# Patient Record
Sex: Male | Born: 1965 | Race: White | Hispanic: No | Marital: Married
Health system: Midwestern US, Academic
[De-identification: ages and names within clinical notes are randomized; demographics above are authoritative.]

---

## 2022-01-28 ENCOUNTER — Encounter: Admit: 2022-01-28 | Discharge: 2022-01-28 | Payer: 59

## 2022-01-28 DIAGNOSIS — R69 Illness, unspecified: Secondary | ICD-10-CM

## 2022-02-15 NOTE — Progress Notes
Radiation Oncology Consultation     History of Present Illness:  56 y.o. male presents with recurrent prostate cancer referred by self for consultation.     PSA history on initial diagnosis:  - 07/11/20: 8.12    On 10/03/20, Dr. Buckner Malta performed an ultrasound-guided prostate biopsy.  Prostate measured 18 cc.    Pathology showed:  - right base: Gleason 3+4 involving 30% of core.  - right lateral base: Gleason 6, 10%  - right lateral mid: Gleason 6, 15%  - left lateral base: 4+5, 50%  - left base: 4+5, 55%  - left lateral mid: 4+5, 95%  - left mid: 4+5, 45%  - left lateral apex: 4+5, 100%  - left apex: 4+5, 75%  A total of 9/12 cores contained cancer.    On 11/12/20, abdomen/pelvic CT at [facility] ): negative.  Same day bone scan: negative.    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     On 11/19/20, Dr. Buckner Malta performed a robotic prostatectomy at [facility].  Non-nerve sparing.    Surgical pathology showed:  - pGleason 4+5.  - Extraprostatic extension at left posterior  - Left seminal vesicle invasion and bladder neck invasion.  - Positive margin at left posterior.  - pT3bN0 (0/8 nodes)    PSA after prostatectomy:  - 03/19/21: <0.13  - 06/30/21: <0.13  - 10/27/21: 0.14  - 01/25/22: 0.19    Baseline symptoms:  - Urinary: IPSS score 3. Dribbling with heavy lifting. No pad use. Reports urgency. Denies dysuria, hematuria, or nocturia.   - GI: No diarrhea or rectal bleeding.   Most recent colonoscopy on 07/11/20. Patient reports follow-up in 10 years.   - Patient has baseline erectile dysfunction.     Past Medical History: Erectile dysfunction.   Prior Radiation History: None     Medications: Atorvastatin, Doxepin, gabapentin, Hydrocodone-acetaminophen, methocarbamol, montelukast, nabumetone, provigil, restasis, zaleplon, Zyrtec     Allergies: NKDA. Social History: Lives in [place], [place].   Accompanied by his wife at consultation today.     Family History: Sister had leukemia.     Review of Systems: see above.      Patient Evaluated for a Clinical Trial: No treatment clinical trial available for this patient.     KARNOFSKY PERFORMANCE SCORE: 90% Able to carry on normal activity; minor signs of disease     Physical Exam: No acute distress.     LABORATORY: see above.  IMAGING: see above  PATHOLOGY: see above        ASSESSMENT: 56 y.o. 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 Surgical pathology showed: pGleason 4+5 cancer, extraprostatic extension at left posterior, bladder neck invasion, left seminal vesicle invasion, positive margin at left posterior. pT3bN0 (0/8 nodes). Post-operative PSA has risen to 0.19 indicating recurrent prostate cancer after prostatectomy.     RECOMMENDATIONS: First, Dr. Imogene Burn recommends a PSMA PET scan to rule out metastatic disease. Findings from PSMA PET can also help with design of radiation treatment.    If the patient does not have distant metastatic disease, then we would recommend salvage radiation therapy with concurrent ADT, with curative intent. If patient has node-positive findings on PSMA PET, ADT would likely be a longer duration, potentially adding abiraterone to Lupron. If there is no nodal involvement,  then 6 months of Lupron would be reasonable.    We specifically reviewed that men with biochemical recurrence who received salvage therapy with a lower PSA (</= 0.2) at the time of initiation of radiotherapy have the best long-term disease control and cure. We also reviewed the role of androgen deprivation therapy (ADT) in salvage therapy. Our recommendations are based on the results of multiple randomized trials. We recommend short term ADT based on reported results from the randomized trial RTOG 9601, which demonstrated that adding hormone therapy to salvage radiotherapy improves overall survival Albertina Parr, NEJM 2017). The GETUG-16 randomized trial showed that adding 6 months of ADT to salvage radiotherapy improved 10-year progression-free survival and metastasis-free survival. RTOG 0534 showed that patients who received salvage RT (with pelvic irradiation) and 6 months of ADT had improved outcomes compared to patients without ADT.    We discussed the logistics of salvage radiation therapy including daily treatment (Monday-Friday) for approximately 7.5 weeks. Dr. Imogene Burn specifically discussed the possibility of receiving radiation treatment at [facility]. Patient declines because [place] is still a long drive for the patient.    We discussed the possible acute side effects of radiation including but not limited to fatigue, increased bowel or bladder frequency or urgency, and change in bowel habits. More long term side effects can also include these symptoms and potential erectile dysfunction.    We also discussed the potential side effects associated with hormonal therapy.  Androgen deprivation can result in hot flashes, fatigue, and decreased sexual interest.    After discussion, patient wishes to proceed with recommendations as detailed above.    Next steps:  - PSMA PET scan now.  - Then likely salvage RT with concurrent ADT.    Chad Cordial, M.D.  PGY-3 Resident Physician, Radiation Oncology  [facility]    ATTENDING NOTE: I saw and examined the patient. Agree with above. No acute distress. PSMA PET scan first to guide final treatment decisions.    Total Time Today was 60 minutes in the following activities: Preparing to see the patient, Obtaining and/or reviewing separately obtained history, Performing a medically appropriate examination and/or evaluation, Counseling and educating the patient/family/caregiver, Ordering medications, tests, or procedures, Referring and communication with other health care professionals (when not separately reported), Documenting clinical information in the electronic or other health record and Independently interpreting results (not separately reported) and communicating results to the patient/family/caregiver

## 2022-02-16 ENCOUNTER — Ambulatory Visit: Admit: 2022-02-16 | Discharge: 2022-02-16 | Payer: 59

## 2022-02-16 ENCOUNTER — Encounter: Admit: 2022-02-16 | Discharge: 2022-02-16 | Payer: 59

## 2022-02-16 DIAGNOSIS — C61 Malignant neoplasm of prostate: Secondary | ICD-10-CM

## 2022-02-16 DIAGNOSIS — M549 Dorsalgia, unspecified: Secondary | ICD-10-CM

## 2022-02-16 MED ORDER — LEUPROLIDE (3 MONTH) 22.5 MG IM SYKT
22.5 mg | Freq: Once | INTRAMUSCULAR | 0 refills
Start: 2022-02-16 — End: ?

## 2022-02-19 ENCOUNTER — Encounter: Admit: 2022-02-19 | Discharge: 2022-02-19 | Payer: 59

## 2022-03-08 ENCOUNTER — Ambulatory Visit: Admit: 2022-03-08 | Discharge: 2022-03-08 | Payer: 59

## 2022-03-08 ENCOUNTER — Encounter: Admit: 2022-03-08 | Discharge: 2022-03-08 | Payer: 59

## 2022-03-08 DIAGNOSIS — C61 Malignant neoplasm of prostate: Secondary | ICD-10-CM

## 2022-03-08 MED ORDER — RP DX F-18 PIFLUFOLASTAT (PSMA) 1 MCI
9 | Freq: Once | INTRAVENOUS | 0 refills | Status: CP
Start: 2022-03-08 — End: ?

## 2022-03-08 MED ORDER — LEUPROLIDE (3 MONTH) 22.5 MG IM SYKT
22.5 mg | Freq: Once | INTRAMUSCULAR | 0 refills | Status: CP
Start: 2022-03-08 — End: ?
  Administered 2022-03-08: 21:00:00 22.5 mg via INTRAMUSCULAR

## 2022-03-08 NOTE — Progress Notes
Patient received an injection of Lupron today. A dose of 22.5 mgs was given IM in right Deltoid. This was injection was # 1 of 2. Patient tolerated the procedure well.. Education was provided to the patient and all questions were answered. Note to nurse- if this is the patient's 4th dose, renew order and authorization for future doses, if applicable.     Pt came from out pt procedure with IV. IV discontinued at this time.

## 2022-03-22 NOTE — Progress Notes
Radiation Oncology Follow-Up Note    Date: 03/23/2022       Robert Terrell is a 56 y.o. male.     There were no encounter diagnoses.  Staging: Cancer Staging   No matching staging information was found for the patient.      Subjective:          Cancer Staging   No matching staging information was found for the patient.      History of Present Illness  Mr. Robert Terrell with a  56 y.o. 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 Surgical pathology showed: pGleason 4+5 cancer, extraprostatic extension at left posterior, bladder neck invasion, left seminal vesicle invasion, positive margin at left posterior. pT3bN0 (0/8 nodes). Post-operative PSA has risen to 0.19 indicating recurrent prostate cancer after prostatectomy.     Mr. Robert Terrell presents today for follow up and for his CT Simulation scan. He reports he has had one day of feeling some fatigue since starting hormone therapy but otherwise he hasn't noticed any changes at this time from the ADT.     Treatment Plan: Salvage radiation therapy (37 fractions)  + ADT x 6 months.     Lupron Dates: 03/08/22, next injection due on 06/08/22    PSA after prostatectomy:  - 03/19/21: <0.13  - 06/30/21: <0.13  - 10/27/21: 0.14  - 01/25/22: 0.19  ?  Baseline symptoms:  - Urinary: IPSS score 3. Dribbling with heavy lifting. No pad use. Reports urgency. Denies dysuria, hematuria, or nocturia.   - GI: No diarrhea or rectal bleeding.   Most recent colonoscopy on 07/11/20. Patient reports follow-up in 10 years.   - Patient has baseline erectile dysfunction.            Review of Systems   Genitourinary: Positive for urgency.   All other systems reviewed and are negative.        Objective:         ? atorvastatin (LIPITOR) 10 mg tablet Take one tablet by mouth daily.   ? doxepin (SINEQUAN) 100 mg capsule Take one capsule by mouth at bedtime daily.   ? gabapentin (NEURONTIN) 300 mg capsule Take one capsule by mouth every 8 hours.   ? HYDROcodone/acetaminophen (NORCO) 5/325 mg tablet Take one tablet to two tablets by mouth every 6 hours as needed for Pain.   ? methocarbamoL (ROBAXIN) 750 mg tablet Take one tablet by mouth four times daily.   ? mirtazapine 45 mg tablet Take one tablet by mouth at bedtime daily.   ? montelukast (SINGULAIR) 10 mg tablet Take one tablet by mouth at bedtime daily.   ? nabumetone (RELAFEN) 750 mg tablet Take one tablet by mouth twice daily.   ? zaleplon (SONATA) 10 mg capsule Take one capsule by mouth at bedtime daily.     Vitals:    03/23/22 1012   Pulse: (P) 68   Resp: 15   SpO2: 98%   PainSc: Zero   Weight: 98.8 kg (217 lb 13 oz)   Height: 175.3 cm (5' 9)     Body mass index is 32.17 kg/m?Marland Kitchen                      Physical Exam      General:  A&Ox3, in no acute distress.  Head:  Normocephalic and atraumatic.  Eyes: Normal sclerae / conjunctivae.  Neck: trachea midline  Lungs: Breathing nonlabored, no wheezing  Abdomen: nondistended  Skin: no rashes or pallor  Psychiatric: Normal mood and affect             Assessment and Plan:  Mr. Robert Terrell with a  56 y.o. 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 Surgical pathology showed: pGleason 4+5 cancer, extraprostatic extension at left posterior, bladder neck invasion, left seminal vesicle invasion, positive margin at left posterior. pT3bN0 (0/8 nodes). Post-operative PSA has risen to 0.19 indicating recurrent prostate cancer after prostatectomy.       1. Prostate Cancer  - Reviewed potential side effects of radiation treatment can include urinary symptoms (increased urinary frequency and urgency, dysuria, and obstructive symptoms), GI symptoms (loose stools and blood in stool), and sexual dysfunction.  However, urinary incontinence is relatively uncommon after radiation treatment.  - Reviewed treatment plan  - Reviewed and obtained informed consent for radiation therapy   - Proceed with CT simulation scan today in clinic        Katie Kami Kube APRN, FNP-C  Nurse Practitioner  Department of Radiation Oncology [facility]    Total time including obtaining and review of records, patient face to face time, order entry, placing referrals, and documentation: 40 minutes.

## 2022-03-23 ENCOUNTER — Ambulatory Visit: Admit: 2022-03-23 | Discharge: 2022-03-23 | Payer: 59

## 2022-03-23 ENCOUNTER — Encounter: Admit: 2022-03-23 | Discharge: 2022-03-23 | Payer: 59

## 2022-03-23 DIAGNOSIS — M549 Dorsalgia, unspecified: Secondary | ICD-10-CM

## 2022-03-23 DIAGNOSIS — C61 Malignant neoplasm of prostate: Secondary | ICD-10-CM

## 2022-03-23 NOTE — Progress Notes
Simulation Procedure Note    Date: 03/23/2022   MAKO PELFREY is a 56 y.o. male.     There were no encounter diagnoses.  Anatomical Site: pelvis    On 03/23/2022,  Mr. Grunewald underwent IMRT (INTENSITY MODULATED RADIATION THERAPY)  simulation procedure on the CT Simulator.      Informed consent has been obtained for the radiation oncology treatment.     The pelvis region was scanned in order to establish treatment fields for radiation therapy and is medically necessary to establish the treatment fields to the pelvis.    During the simulation process Brixon Zhen was setup in the supine position and a customized vac loc immobilization device was used.       Axial CT images were acquired through the region of interest in order to establish the appropriate isocenter.  These images will be transferred to the treatment planning system in order to identify the dose volumes for the targeted treatment area and for delineation of the normal critical structures, including the bowel and bladder  which are in close proximity of the targeted area of treatment.                   Verner Mould, MD

## 2022-03-30 ENCOUNTER — Ambulatory Visit: Admit: 2022-03-30 | Discharge: 2022-03-30 | Payer: 59

## 2022-04-01 ENCOUNTER — Encounter: Admit: 2022-04-01 | Discharge: 2022-04-01 | Payer: 59

## 2022-04-01 ENCOUNTER — Ambulatory Visit: Admit: 2022-04-01 | Discharge: 2022-04-01 | Payer: 59

## 2022-04-02 ENCOUNTER — Encounter: Admit: 2022-04-02 | Discharge: 2022-04-02 | Payer: 59

## 2022-04-02 ENCOUNTER — Ambulatory Visit: Admit: 2022-04-02 | Discharge: 2022-04-02 | Payer: 59

## 2022-04-05 ENCOUNTER — Encounter: Admit: 2022-04-05 | Discharge: 2022-04-05 | Payer: 59

## 2022-04-05 ENCOUNTER — Ambulatory Visit: Admit: 2022-04-05 | Discharge: 2022-04-05 | Payer: 59

## 2022-04-06 ENCOUNTER — Encounter: Admit: 2022-04-06 | Discharge: 2022-04-06 | Payer: 59

## 2022-04-06 ENCOUNTER — Ambulatory Visit: Admit: 2022-04-06 | Discharge: 2022-04-06 | Payer: 59

## 2022-04-07 ENCOUNTER — Encounter: Admit: 2022-04-07 | Discharge: 2022-04-07 | Payer: 59

## 2022-04-07 ENCOUNTER — Ambulatory Visit: Admit: 2022-04-07 | Discharge: 2022-04-07 | Payer: 59

## 2022-04-07 DIAGNOSIS — C61 Malignant neoplasm of prostate: Secondary | ICD-10-CM

## 2022-04-08 ENCOUNTER — Encounter: Admit: 2022-04-08 | Discharge: 2022-04-08 | Payer: 59

## 2022-04-08 ENCOUNTER — Ambulatory Visit: Admit: 2022-04-08 | Discharge: 2022-04-08 | Payer: 59

## 2022-04-08 NOTE — Progress Notes
Weekly Management Progress Note    Date: 04/07/2022    Robert Terrell is a 56 y.o. male.     Vitals:  There were no vitals filed for this visit.  Subjective   There were no encounter diagnoses.  Staging:  Cancer Staging   No matching staging information was found for the patient.    There is no height or weight on file to calculate BMI.             Baseline symptoms:  - Urinary: IPSS score 3. Dribbling with heavy lifting. No pad use. Reports urgency. Denies dysuria, hematuria, or nocturia.   - GI: No diarrhea or rectal bleeding.   Most recent colonoscopy on 07/11/20. Patient reports follow-up in 10 years.   - Patient has baseline erectile dysfunction.     Treatment Plan: Salvage radiation therapy (37 fractions)  + ADT x 6 months.     Treatment Data Summary:      Treatment Data 04/01/2022 04/02/2022 04/05/2022 04/06/2022 04/07/2022   Course ID C1 Pelvis 23 C1 Pelvis 23 C1 Pelvis 23 C1 Pelvis 23 C1 Pelvis 23   Plan ID Pelvis Pelvis Pelvis Pelvis Pelvis   Prescription Dose (cGy) 5,040 5,040 5,040 5,040 5,040   Prescribed Dose per Fraction (Gy) 1.8 1.8 1.8 1.8 1.8   Fractions Treated to Date 1 2 3 4 5    Total Fractions on Plan 28 28 28 28 28    Treatment Elapsed Days 0 1 4 5 6    Reference Point ID Pelvis Pelvis Pelvis Pelvis Pelvis   Dosage Given to Date (Gy) 1.8 3.6 5.4 7.2 9        Subjective:  Robert Terrell reports that he is doing well.  No changes in bowels or urination.  He has noted a slight burning sensation when he wipes after bowel movements - this started over the weekend.  He does have a history of hemorrhoids.      Objective:   Vitals:    04/07/22 1910   BP: (!) 146/90   Pulse: 64   SpO2: 99%     NAD, AOx3     Assessment: Doing well.  Started RT    Plan:   1.  Continue RT  2.  Monitor the burning sensation in the peri-anal area.  I think this may be hemorrhoids.  Would try rectal hydrocortisone if persists.    Harriette Bouillon, MD  Attending Physician  Department of Radiation Oncology, [facility]    ATTESTATION    I personally performed the E/M including history, physical exam, and MDM.    Staff name:  Harriette Bouillon, MD Date: 04/07/2022

## 2022-04-09 ENCOUNTER — Encounter: Admit: 2022-04-09 | Discharge: 2022-04-09 | Payer: 59

## 2022-04-09 ENCOUNTER — Ambulatory Visit: Admit: 2022-04-09 | Discharge: 2022-04-09 | Payer: 59

## 2022-04-12 ENCOUNTER — Encounter: Admit: 2022-04-12 | Discharge: 2022-04-12 | Payer: 59

## 2022-04-12 ENCOUNTER — Ambulatory Visit: Admit: 2022-04-12 | Discharge: 2022-04-12 | Payer: 59

## 2022-04-13 ENCOUNTER — Encounter: Admit: 2022-04-13 | Discharge: 2022-04-13 | Payer: 59

## 2022-04-13 ENCOUNTER — Ambulatory Visit: Admit: 2022-04-13 | Discharge: 2022-04-13 | Payer: 59

## 2022-04-13 DIAGNOSIS — C61 Malignant neoplasm of prostate: Secondary | ICD-10-CM

## 2022-04-14 ENCOUNTER — Encounter: Admit: 2022-04-14 | Discharge: 2022-04-14 | Payer: 59

## 2022-04-14 ENCOUNTER — Ambulatory Visit: Admit: 2022-04-14 | Discharge: 2022-04-14 | Payer: 59

## 2022-04-14 NOTE — Progress Notes
Weekly Management Progress Note    Date: 04/13/2022    Robert Terrell is a 56 y.o. male.     Vitals:  Vitals:    04/13/22 1908   BP: 136/60   Pulse: 79   SpO2: 99%     Subjective   There were no encounter diagnoses.  Staging:  Cancer Staging   No matching staging information was found for the patient.    There is no height or weight on file to calculate BMI.             Baseline symptoms:  - Urinary: IPSS score 2. Dribbling with heavy lifting. No pad use. Reports urgency less than 1 time in 5. Denies dysuria, hematuria. Nocturia x 1. Mostly satisfied  - GI: No diarrhea or rectal bleeding.   Most recent colonoscopy on 07/11/20. Patient reports follow-up in 10 years.   - Patient has baseline erectile dysfunction.     Treatment Plan: Salvage radiation therapy (37 fractions)  + ADT x 6 months.     Treatment Data Summary:      Treatment Data 04/02/2022 04/05/2022 04/06/2022 04/07/2022 04/08/2022 04/09/2022 04/12/2022   Course ID C1 Pelvis 23 C1 Pelvis 23 C1 Pelvis 23 C1 Pelvis 23 C1 Pelvis 23 C1 Pelvis 23 C1 Pelvis 23   Plan ID Pelvis Pelvis Pelvis Pelvis Pelvis Pelvis Pelvis   Prescription Dose (cGy) 5,040 5,040 5,040 5,040 5,040 5,040 5,040   Prescribed Dose per Fraction (Gy) 1.8 1.8 1.8 1.8 1.8 1.8 1.8   Fractions Treated to Date 2 3 4 5 6 7 8    Total Fractions on Plan 28 28 28 28 28 28 28    Treatment Elapsed Days 1 4 5 6 7 8 11    Reference Point ID Pelvis Pelvis Pelvis Pelvis Pelvis Pelvis Pelvis   Dosage Given to Date (Gy) 3.6 5.4 7.2 9 10.8 12.6 14.4      Subjective:  Mr. Chura reports that he is doing well.  No changes in bowels or urination.  He has noted a slight burning sensation when he wipes after bowel movements - this started over the weekend.  He does have a history of hemorrhoids.      Objective:   Vitals:    04/13/22 1908   BP: 136/60   Pulse: 79   SpO2: 99%     NAD, AOx3     Assessment: Doing well.  Started RT    Plan:   1.  Continue RT  2.  Monitor the burning sensation in the peri-anal area. Likely hemorrhoids.  Trying hydrocortisone cream    Raechel Chute, MD  Attending Physician  Department of Radiation Oncology, [facility]    ATTESTATION    I personally performed the E/M including history, physical exam, and MDM.    Staff name:  Morton Stall, MD Date: 04/13/2022

## 2022-04-16 ENCOUNTER — Encounter: Admit: 2022-04-16 | Discharge: 2022-04-16 | Payer: 59

## 2022-04-16 ENCOUNTER — Ambulatory Visit: Admit: 2022-04-16 | Discharge: 2022-04-16 | Payer: 59

## 2022-04-19 ENCOUNTER — Ambulatory Visit: Admit: 2022-04-19 | Discharge: 2022-04-19 | Payer: 59

## 2022-04-19 ENCOUNTER — Encounter: Admit: 2022-04-19 | Discharge: 2022-04-19 | Payer: 59

## 2022-04-20 ENCOUNTER — Encounter: Admit: 2022-04-20 | Discharge: 2022-04-20 | Payer: 59

## 2022-04-20 ENCOUNTER — Ambulatory Visit: Admit: 2022-04-20 | Discharge: 2022-04-20 | Payer: 59

## 2022-04-20 DIAGNOSIS — C61 Malignant neoplasm of prostate: Secondary | ICD-10-CM

## 2022-04-20 DIAGNOSIS — M549 Dorsalgia, unspecified: Secondary | ICD-10-CM

## 2022-04-21 ENCOUNTER — Ambulatory Visit: Admit: 2022-04-21 | Discharge: 2022-04-21 | Payer: 59

## 2022-04-21 ENCOUNTER — Encounter: Admit: 2022-04-21 | Discharge: 2022-04-21 | Payer: 59

## 2022-04-23 ENCOUNTER — Encounter: Admit: 2022-04-23 | Discharge: 2022-04-23 | Payer: 59

## 2022-04-23 ENCOUNTER — Ambulatory Visit: Admit: 2022-04-23 | Discharge: 2022-04-23 | Payer: 59

## 2022-04-26 ENCOUNTER — Ambulatory Visit: Admit: 2022-04-26 | Discharge: 2022-04-26 | Payer: 59

## 2022-04-26 ENCOUNTER — Encounter: Admit: 2022-04-26 | Discharge: 2022-04-26 | Payer: 59

## 2022-04-26 DIAGNOSIS — C61 Malignant neoplasm of prostate: Secondary | ICD-10-CM

## 2022-04-26 NOTE — Progress Notes
Weekly Management Progress Note    Date: 04/26/2022      History of Present Illness: 56 y.o. 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 Surgical pathology showed: pGleason 4+5 cancer, extraprostatic extension at left posterior, bladder neck invasion, left seminal vesicle invasion, positive margin at left posterior. pT3bN0 (0/8 nodes). Post-operative PSA has risen to 0.19 indicating recurrent prostate cancer after prostatectomy.     Treatment Plan: Salvage RT with 6 months of androgen deprivation therapy - with curative intent.  - Lupron #1 given on 03/08/22.    Baseline symptoms:  - Urinary: IPSS score 2. Dribbling with heavy lifting. No pad use. Reports urgency less than 1 time in 5. Denies dysuria, hematuria. Nocturia x 1. Mostly satisfied  - GI: No diarrhea or rectal bleeding.   Most recent colonoscopy on 07/11/20. Patient reports follow-up in 10 years.   - Patient has baseline erectile dysfunction.     Current symptoms: patient has received 17/37 radiation treatments.  - Urinary: IPSS score 2. Nocturia 1x. No other symptoms. No pad.  - GI; No diarrhea or rectal bleeding.    Vitals:  Vitals:    04/26/22 1841   BP: (!) 155/93   BP Source: Arm, Left Upper   Pulse: 61   SpO2: 100%   PainSc: Zero     Subjective   There were no encounter diagnoses.  Staging:  Cancer Staging   No matching staging information was found for the patient.    There is no height or weight on file to calculate BMI.  Pain Score: Zero            Treatment Data Summary:      Treatment Data 04/15/2022 04/16/2022 04/19/2022 04/20/2022 04/21/2022 04/23/2022 04/26/2022   Course ID C1 Pelvis 23 C1 Pelvis 23 C1 Pelvis 23 C1 Pelvis 23 C1 Pelvis 23 C1 Pelvis 23 C1 Pelvis 23   Plan ID Pelvis Pelvis Pelvis Pelvis Pelvis Pelvis Pelvis   Prescription Dose (cGy) 5,040 5,040 5,040 5,040 5,040 5,040 5,040   Prescribed Dose per Fraction (Gy) 1.8 1.8 1.8 1.8 1.8 1.8 1.8   Fractions Treated to Date 11 12 13 14 15 16 17    Total Fractions on Plan 28 28 28 28 28 28 28    Treatment Elapsed Days 14 15 18 19 20 22 25    Reference Point ID Pelvis Pelvis Pelvis Pelvis Pelvis Pelvis Pelvis   Dosage Given to Date (Gy) 19.8 21.6 23.4 25.2 27 28.8 30.6       Objective:   Vitals:    04/26/22 1841   BP: (!) 155/93   Pulse: 61   SpO2: 100%     NAD, AOx3     Assessment/Plan:   - Patient is doing well with salvage radiation therapy for recurrent prostate cancer. He has essentially no acute GI or urinary side effects from treatment.  - Continue RT.

## 2022-04-27 ENCOUNTER — Encounter: Admit: 2022-04-27 | Discharge: 2022-04-27 | Payer: 59

## 2022-04-27 ENCOUNTER — Ambulatory Visit: Admit: 2022-04-27 | Discharge: 2022-04-27 | Payer: 59

## 2022-04-28 ENCOUNTER — Ambulatory Visit: Admit: 2022-04-28 | Discharge: 2022-04-28 | Payer: 59

## 2022-04-28 ENCOUNTER — Encounter: Admit: 2022-04-28 | Discharge: 2022-04-28 | Payer: 59

## 2022-04-29 ENCOUNTER — Encounter: Admit: 2022-04-29 | Discharge: 2022-04-29 | Payer: 59

## 2022-04-29 ENCOUNTER — Ambulatory Visit: Admit: 2022-04-29 | Discharge: 2022-04-29 | Payer: 59

## 2022-04-30 ENCOUNTER — Encounter: Admit: 2022-04-30 | Discharge: 2022-04-30 | Payer: 59

## 2022-04-30 ENCOUNTER — Ambulatory Visit: Admit: 2022-04-30 | Discharge: 2022-04-30 | Payer: 59

## 2022-05-03 ENCOUNTER — Encounter: Admit: 2022-05-03 | Discharge: 2022-05-03 | Payer: 59

## 2022-05-03 ENCOUNTER — Ambulatory Visit: Admit: 2022-05-03 | Discharge: 2022-05-03 | Payer: 59

## 2022-05-04 ENCOUNTER — Encounter: Admit: 2022-05-04 | Discharge: 2022-05-04 | Payer: 59

## 2022-05-04 ENCOUNTER — Ambulatory Visit: Admit: 2022-05-04 | Discharge: 2022-05-04 | Payer: 59

## 2022-05-04 DIAGNOSIS — M549 Dorsalgia, unspecified: Secondary | ICD-10-CM

## 2022-05-04 DIAGNOSIS — C61 Malignant neoplasm of prostate: Secondary | ICD-10-CM

## 2022-05-05 ENCOUNTER — Encounter: Admit: 2022-05-05 | Discharge: 2022-05-05 | Payer: 59

## 2022-05-05 ENCOUNTER — Ambulatory Visit: Admit: 2022-05-05 | Discharge: 2022-05-05 | Payer: 59

## 2022-05-05 NOTE — Progress Notes
Weekly Management Progress Note    Date: 05/04/2022      History of Present Illness: 56 y.o. 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 Surgical pathology showed: pGleason 4+5 cancer, extraprostatic extension at left posterior, bladder neck invasion, left seminal vesicle invasion, positive margin at left posterior. pT3bN0 (0/8 nodes). Post-operative PSA has risen to 0.19 indicating recurrent prostate cancer after prostatectomy.     Treatment Plan: Salvage RT with 6 months of androgen deprivation therapy - with curative intent.  - Lupron #1 given on 03/08/22.    Baseline symptoms:  - Urinary: IPSS score 2. Dribbling with heavy lifting. No pad use. Reports urgency less than 1 time in 5. Denies dysuria, hematuria. Nocturia x 1. Mostly satisfied  - GI: No diarrhea or rectal bleeding.   Most recent colonoscopy on 07/11/20. Patient reports follow-up in 10 years.   - Patient has baseline erectile dysfunction.     Current symptoms: patient has received 23/37 radiation treatments.  - Urinary: IPSS score 2. Nocturia 1x. No other symptoms. No pad.  - GI; No diarrhea or rectal bleeding.    Vitals:  Vitals:    05/04/22 1903   BP: (!) 151/88   Pulse: 69   SpO2: 98%   PainSc: Zero     Subjective   There were no encounter diagnoses.  Staging:  Cancer Staging   No matching staging information was found for the patient.    There is no height or weight on file to calculate BMI.  Pain Score: Zero            Treatment Data Summary:      Treatment Data 04/27/2022 04/27/2022 04/28/2022 04/29/2022 04/30/2022 05/03/2022 05/04/2022   Course ID C1 Pelvis 23 C1 Pelvis 23 C1 Pelvis 23 C1 Pelvis 23 C1 Pelvis 23 C1 Pelvis 23 C1 Pelvis 23   Plan ID Pelvis Pelvis Pelvis Pelvis Pelvis Pelvis Pelvis   Prescription Dose (cGy) 5,040 5,040 5,040 5,040 5,040 5,040 5,040   Prescribed Dose per Fraction (Gy) 1.8 1.8 1.8 1.8 1.8 1.8 1.8   Fractions Treated to Date 18 18 19 20 21 22 23    Total Fractions on Plan 28 28 28 28 28 28 28    Treatment Elapsed Days 26 26 27 28 29  32 33   Reference Point ID Pelvis Pelvis Pelvis Pelvis Pelvis Pelvis Pelvis   Dosage Given to Date (Gy) 56.21308657 32.4 34.2 36 37.8 39.6 41.4       Objective:   Vitals:    05/04/22 1903   BP: (!) 151/88   Pulse: 69   SpO2: 98%     NAD, AOx3     Assessment/Plan:   - Patient is doing well with salvage radiation therapy for recurrent prostate cancer. He has essentially no acute GI or urinary side effects from treatment.  - Continue RT.

## 2022-05-06 ENCOUNTER — Ambulatory Visit: Admit: 2022-05-06 | Discharge: 2022-05-06 | Payer: 59

## 2022-05-06 ENCOUNTER — Encounter: Admit: 2022-05-06 | Discharge: 2022-05-06 | Payer: 59

## 2022-05-07 ENCOUNTER — Ambulatory Visit: Admit: 2022-05-07 | Discharge: 2022-05-07 | Payer: 59

## 2022-05-07 ENCOUNTER — Encounter: Admit: 2022-05-07 | Discharge: 2022-05-07 | Payer: 59

## 2022-05-10 ENCOUNTER — Ambulatory Visit: Admit: 2022-05-10 | Discharge: 2022-05-10 | Payer: 59

## 2022-05-10 ENCOUNTER — Encounter: Admit: 2022-05-10 | Discharge: 2022-05-10 | Payer: 59

## 2022-05-11 ENCOUNTER — Encounter: Admit: 2022-05-11 | Discharge: 2022-05-11 | Payer: 59

## 2022-05-11 ENCOUNTER — Ambulatory Visit: Admit: 2022-05-11 | Discharge: 2022-05-11 | Payer: 59

## 2022-05-11 DIAGNOSIS — C61 Malignant neoplasm of prostate: Secondary | ICD-10-CM

## 2022-05-11 NOTE — Progress Notes
Weekly Management Progress Note    Date: 05/11/2022      History of Present Illness: 56 y.o. 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 Surgical pathology showed: pGleason 4+5 cancer, extraprostatic extension at left posterior, bladder neck invasion, left seminal vesicle invasion, positive margin at left posterior. pT3bN0 (0/8 nodes). Post-operative PSA has risen to 0.19 indicating recurrent prostate cancer after prostatectomy.     Treatment Plan: Salvage RT with 6 months of androgen deprivation therapy - with curative intent.  - Lupron #1 given on 03/08/22.    Baseline symptoms:  - Urinary: IPSS score 2. Dribbling with heavy lifting. No pad use. Reports urgency less than 1 time in 5. Denies dysuria, hematuria. Nocturia x 1. Mostly satisfied  - GI: No diarrhea or rectal bleeding.   Most recent colonoscopy on 07/11/20. Patient reports follow-up in 10 years.   - Patient has baseline erectile dysfunction.     Current symptoms: patient has received 28/37 radiation treatments.  - Urinary: IPSS score 7. Nocturia 2x. Some urgency and frequency. No pad.  - GI; No diarrhea or rectal bleeding.    Vitals:  Vitals:    05/11/22 1633 05/11/22 1635   BP: (!) 140/85    Pulse: 76    Temp: 36.4 ?C (97.6 ?F)    Resp: 16    SpO2: 99%    TempSrc: Temporal    PainSc:  Zero   Weight: 95.8 kg (211 lb 3.2 oz)    Height: 175.3 cm (5' 9)      Subjective   There were no encounter diagnoses.  Staging:  Cancer Staging   No matching staging information was found for the patient.    Body mass index is 31.19 kg/m?Marland Kitchen  Pain Score: Zero     Fatigue Scale: 1      Treatment Data Summary:      Treatment Data 04/30/2022 05/03/2022 05/04/2022 05/05/2022 05/06/2022 05/07/2022 05/10/2022   Course ID C1 Pelvis 23 C1 Pelvis 23 C1 Pelvis 23 C1 Pelvis 23 C1 Pelvis 23 C1 Pelvis 23 C1 Pelvis 23   Plan ID Pelvis Pelvis Pelvis Pelvis Pelvis Pelvis Pelvis   Prescription Dose (cGy) 5,040 5,040 5,040 5,040 5,040 5,040 5,040   Prescribed Dose per Fraction (Gy) 1.8 1.8 1.8 1.8 1.8 1.8 1.8   Fractions Treated to Date 21 22 23 24 25 26 27    Total Fractions on Plan 28 28 28 28 28 28 28    Treatment Elapsed Days 29 32 33 34 35 36 39   Reference Point ID Pelvis Pelvis Pelvis Pelvis Pelvis Pelvis Pelvis   Dosage Given to Date (Gy) 37.8 39.6 41.4 43.2 45 46.8 48.6       Objective:   Vitals:    05/11/22 1633   BP: (!) 140/85   Pulse: 76   Temp: 36.4 ?C (97.6 ?F)   Resp: 16   SpO2: 99%     NAD, AOx3     Assessment/Plan:   - Patient is doing well with salvage radiation therapy for recurrent prostate cancer. He has no significant acute GI or urinary side effects from treatment.  - Continue RT.

## 2022-05-12 ENCOUNTER — Ambulatory Visit: Admit: 2022-05-12 | Discharge: 2022-05-12 | Payer: 59

## 2022-05-12 ENCOUNTER — Encounter: Admit: 2022-05-12 | Discharge: 2022-05-12 | Payer: 59

## 2022-05-13 ENCOUNTER — Encounter: Admit: 2022-05-13 | Discharge: 2022-05-13 | Payer: 59

## 2022-05-13 ENCOUNTER — Ambulatory Visit: Admit: 2022-05-13 | Discharge: 2022-05-13 | Payer: 59

## 2022-05-14 ENCOUNTER — Ambulatory Visit: Admit: 2022-05-14 | Discharge: 2022-05-14 | Payer: 59

## 2022-05-14 ENCOUNTER — Encounter: Admit: 2022-05-14 | Discharge: 2022-05-14 | Payer: 59

## 2022-05-19 ENCOUNTER — Ambulatory Visit: Admit: 2022-05-19 | Discharge: 2022-05-19 | Payer: 59

## 2022-05-19 ENCOUNTER — Encounter: Admit: 2022-05-19 | Discharge: 2022-05-19 | Payer: 59

## 2022-05-19 DIAGNOSIS — C61 Malignant neoplasm of prostate: Secondary | ICD-10-CM

## 2022-05-20 ENCOUNTER — Encounter: Admit: 2022-05-20 | Discharge: 2022-05-20 | Payer: 59

## 2022-05-20 ENCOUNTER — Ambulatory Visit: Admit: 2022-05-20 | Discharge: 2022-05-20 | Payer: 59

## 2022-05-20 NOTE — Progress Notes
Weekly Management Progress Note    Date: 05/19/2022      History of Present Illness: 56 y.o. 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 Surgical pathology showed: pGleason 4+5 cancer, extraprostatic extension at left posterior, bladder neck invasion, left seminal vesicle invasion, positive margin at left posterior. pT3bN0 (0/8 nodes). Post-operative PSA has risen to 0.19 indicating recurrent prostate cancer after prostatectomy.     Treatment Plan: Salvage RT with 6 months of androgen deprivation therapy - with curative intent.  - Lupron #1 given on 03/08/22.    Baseline symptoms:  - Urinary: IPSS score 2. Dribbling with heavy lifting. No pad use. Reports urgency less than 1 time in 5. Denies dysuria, hematuria. Nocturia x 1. Mostly satisfied  - GI: No diarrhea or rectal bleeding.   Most recent colonoscopy on 07/11/20. Patient reports follow-up in 10 years.   - Patient has baseline erectile dysfunction.     Current symptoms: patient has received 32/37 radiation treatments.  - Reports nighttime hot flashes.  - Urinary: IPSS score 3. Nocturia 1x. Mild urgency and frequency. No pad.  - GI; No diarrhea or rectal bleeding.    Vitals:  Vitals:    05/19/22 1917   BP: (!) 145/84   BP Source: Arm, Left Upper   Pulse: 69   SpO2: 99%   PainSc: Zero   Weight: 95.2 kg (209 lb 14.1 oz)     Subjective   There were no encounter diagnoses.  Staging:  Cancer Staging   No matching staging information was found for the patient.    Body mass index is 30.99 kg/m?Marland Kitchen  Pain Score: Zero            Treatment Data Summary:      Treatment Data 05/07/2022 05/10/2022 05/11/2022 05/12/2022 05/13/2022 05/14/2022 05/19/2022   Course ID C1 Pelvis 23 C1 Pelvis 23 C1 Pelvis 23 C1 Pelvis 23 C1 Pelvis 23 C1 Pelvis 23 C1 Pelvis 23   Plan ID Pelvis Pelvis Pelvis ProstateBedBV ProstateBedBV ProstateBedBV ProstateBedBV Prescription Dose (cGy) 5,040 5,040 5,040 1,620 1,620 1,620 1,620   Prescribed Dose per Fraction (Gy) 1.8 1.8 1.8 1.8 1.8 1.8 1.8   Fractions Treated to Date 26 27 28 1 2 3 4    Total Fractions on Plan 28 28 28 9 9 9 9    Treatment Elapsed Days 36 39 40 41 42 43 48   Reference Point ID Pelvis Pelvis Pelvis ProstateBedBV ProstateBedBV ProstateBedBV ProstateBedBV   Dosage Given to Date (Gy) 46.8 48.6 50.4 1.8 3.6 5.4 7.2       Objective:   Vitals:    05/19/22 1917   BP: (!) 145/84   Pulse: 69   SpO2: 99%     NAD, AOx3     Assessment/Plan:   - Patient is doing well with salvage radiation therapy for recurrent prostate cancer. He has no significant acute GI or urinary side effects from treatment.  - Continue RT.

## 2022-05-21 ENCOUNTER — Ambulatory Visit: Admit: 2022-05-21 | Discharge: 2022-05-21 | Payer: 59

## 2022-05-21 ENCOUNTER — Encounter: Admit: 2022-05-21 | Discharge: 2022-05-21 | Payer: 59

## 2022-05-24 ENCOUNTER — Encounter: Admit: 2022-05-24 | Discharge: 2022-05-24 | Payer: 59

## 2022-05-24 ENCOUNTER — Ambulatory Visit: Admit: 2022-05-24 | Discharge: 2022-05-24 | Payer: 59

## 2022-05-25 ENCOUNTER — Ambulatory Visit: Admit: 2022-05-25 | Discharge: 2022-05-25 | Payer: 59

## 2022-05-25 ENCOUNTER — Encounter: Admit: 2022-05-25 | Discharge: 2022-05-25 | Payer: 59

## 2022-05-26 ENCOUNTER — Ambulatory Visit: Admit: 2022-05-26 | Discharge: 2022-05-26 | Payer: 59

## 2022-05-26 ENCOUNTER — Encounter: Admit: 2022-05-26 | Discharge: 2022-05-26 | Payer: 59

## 2022-06-07 ENCOUNTER — Ambulatory Visit: Admit: 2022-06-07 | Discharge: 2022-06-07 | Payer: 59

## 2022-06-07 ENCOUNTER — Encounter: Admit: 2022-06-07 | Discharge: 2022-06-07 | Payer: 59

## 2022-06-07 DIAGNOSIS — C61 Malignant neoplasm of prostate: Secondary | ICD-10-CM

## 2022-06-07 MED ORDER — LEUPROLIDE (3 MONTH) 22.5 MG IM SYKT
22.5 mg | Freq: Once | INTRAMUSCULAR | 0 refills | Status: CP
Start: 2022-06-07 — End: ?
  Administered 2022-06-07: 21:00:00 22.5 mg via INTRAMUSCULAR

## 2022-06-07 NOTE — Progress Notes
Patient received an injection of lupron today. A dose of 22.5 mgs was given IM in right deltoid. This was injection was # 2 of 2. Patient tolerated the procedure well.. Education was provided to the patient and all questions were answered. Note to nurse- if this is the patient's 4th dose, renew order and authorization for future doses, if applicable.

## 2022-08-20 ENCOUNTER — Encounter: Admit: 2022-08-20 | Discharge: 2022-08-20 | Payer: 59

## 2022-08-24 NOTE — Progress Notes
Radiation Oncology Follow-Up Note    Date: 08/27/2022       Robert Terrell is a 56 y.o. male.     There were no encounter diagnoses.  Staging:  Cancer Staging   No matching staging information was found for the patient.      Subjective:           Cancer Staging   No matching staging information was found for the patient.      History of Present Illness  Robert Terrell with a  56 y.o. 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 Surgical pathology showed: pGleason 4+5 cancer, extraprostatic extension at left posterior, bladder neck invasion, left seminal vesicle invasion, positive margin at left posterior. pT3bN0 (0/8 nodes). Post-operative PSA has risen to 0.19 indicating recurrent prostate cancer after prostatectomy.     Robert Terrell presents today for his 3 month follow up s/p salvage radiation therapy. He is having some hot flashes and some fatigue but this is tolerable.  He continues to remains active.     He denies any changes in his health or concerns at today's visit.     Treatment Plan: Salvage radiation therapy (37 fractions)  + ADT x 6 months.     Lupron Dates: 03/08/22, 06/07/22,    PSA after prostatectomy:  - 03/19/21: <0.13  - 06/30/21: <0.13  - 10/27/21: 0.14  - 01/25/22: 0.19  ?  Baseline symptoms:  - Urinary: IPSS score 3. Dribbling with heavy lifting. No pad use. Reports urgency. Denies dysuria, hematuria, or nocturia.   - GI: No diarrhea or rectal bleeding.   Most recent colonoscopy on 07/11/20. Patient reports follow-up in 10 years.   - Patient has baseline erectile dysfunction.     Current Symptoms:  Urinary: IPSS=1, he has some urgency , denies straining, incontinence, frequency, hematuria, dysuria.   GI: denies diarrhea or blood in stool.            Review of Systems  See above.     Objective:         ? atorvastatin (LIPITOR) 10 mg tablet Take one tablet by mouth daily.   ? doxepin (SINEQUAN) 100 mg capsule Take one capsule by mouth at bedtime daily.   ? gabapentin (NEURONTIN) 300 mg capsule Take one capsule by mouth every 8 hours.   ? HYDROcodone/acetaminophen (NORCO) 5/325 mg tablet Take one tablet to two tablets by mouth every 6 hours as needed for Pain.   ? methocarbamoL (ROBAXIN) 750 mg tablet Take one tablet by mouth four times daily.   ? mirtazapine 45 mg tablet Take one tablet by mouth at bedtime daily.   ? montelukast (SINGULAIR) 10 mg tablet Take one tablet by mouth at bedtime daily.   ? nabumetone (RELAFEN) 750 mg tablet Take one tablet by mouth twice daily.   ? zaleplon (SONATA) 10 mg capsule Take one capsule by mouth at bedtime daily.     Vitals:    08/27/22 1019   BP: 117/78   BP Source: Arm, Left Upper   Pulse: 71   Temp: 36.1 ?C (96.9 ?F)   SpO2: 97%   TempSrc: Temporal   PainSc: Zero   Weight: 98.3 kg (216 lb 11.4 oz)   Height: 175.3 cm (5' 9)     Body mass index is 32 kg/m?Marland Kitchen  Physical Exam      General:  A&Ox3, in no acute distress.  Head:  Normocephalic and atraumatic.  Eyes: Normal sclerae / conjunctivae.  Neck: trachea midline  Lungs: Breathing nonlabored, no wheezing  Abdomen: nondistended  Skin: no rashes or pallor  Psychiatric: Normal mood and affect      Lab Results   Component Value Date    PSA <0.01 08/27/2022       Testosterone,Total   Date Value Ref Range Status   08/27/2022 10 (L) 270 - 1,070 NG/DL Final              Assessment and Plan:  Robert Terrell with a  56 y.o. 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 Surgical pathology showed: pGleason 4+5 cancer, extraprostatic extension at left posterior, bladder neck invasion, left seminal vesicle invasion, positive margin at left posterior. pT3bN0 (0/8 nodes). Post-operative PSA has risen to 0.19 indicating recurrent prostate cancer after prostatectomy. 1. Prostate Cancer  - PSA today is <0.01 this remains undetectable.   - Testosterone today is 10- this remains in castrate range  - Discussed expected testosterone recovery timeline.   - Robert Terrell has very little side effects from his radiation therapy. He reports he may have some urgency but his IPSS score is improved from baseline. IPSS=1 today vs IPSS=3 at baseline.   - Will continue to monitor PSA  - Discussed expected follow up schedule to consist of another PSA in 3 months and we will then see him every 6 months with a PSA.   - Follow up in 3 months for labs and clinic visit      Melodye Ped APRN, FNP-C  Nurse Practitioner  Department of Radiation Oncology [facility]    Total time including obtaining and review of records, patient face to face time, order entry, placing referrals, and documentation: 30 minutes

## 2022-08-26 ENCOUNTER — Encounter: Admit: 2022-08-26 | Discharge: 2022-08-26 | Payer: 59

## 2022-08-26 DIAGNOSIS — C61 Malignant neoplasm of prostate: Secondary | ICD-10-CM

## 2022-08-27 ENCOUNTER — Encounter: Admit: 2022-08-27 | Discharge: 2022-08-27 | Payer: BC Managed Care – PPO

## 2022-08-27 ENCOUNTER — Ambulatory Visit: Admit: 2022-08-27 | Discharge: 2022-08-27 | Payer: BC Managed Care – PPO

## 2022-08-27 DIAGNOSIS — C61 Malignant neoplasm of prostate: Secondary | ICD-10-CM

## 2022-08-27 DIAGNOSIS — M549 Dorsalgia, unspecified: Secondary | ICD-10-CM

## 2022-08-27 LAB — TESTOSTERONE,TOTAL: TESTOSTERONE, TOTAL: 10 ng/dL — ABNORMAL LOW (ref 270–1070)

## 2022-08-27 LAB — PROSTATIC SPECIFIC ANTIGEN-PSA

## 2022-11-26 NOTE — Progress Notes
Radiation Oncology Follow-Up Note    Date: 11/30/2022       Robert Terrell is a 56 y.o. male.     There were no encounter diagnoses.  Staging:  Cancer Staging   No matching staging information was found for the patient.      Subjective:           Cancer Staging   No matching staging information was found for the patient.      History of Present Illness  Robert Terrell with a  56 y.o. 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 Surgical pathology showed: pGleason 4+5 cancer, extraprostatic extension at left posterior, bladder neck invasion, left seminal vesicle invasion, positive margin at left posterior. pT3bN0 (0/8 nodes). Post-operative PSA has risen to 0.19 indicating recurrent prostate cancer after prostatectomy.     Robert Terrell presents today for his 6 month follow up s/p salvage radiation therapy. He is having some hot flashes still but very minimal amount. He reports he feels he gets his energy is coming back.  He continues to remains active. 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     He denies any changes in his health or concerns at today's visit.     Treatment Plan: Salvage radiation therapy (37 fractions) (completed on 05/26/22)  + ADT x 6 months.     Lupron Dates: 03/08/22, 06/07/22,    PSA after prostatectomy:  - 03/19/21: <0.13  - 06/30/21: <0.13  - 10/27/21: 0.14  - 01/25/22: 0.19     Baseline symptoms:  - Urinary: IPSS score 3. Dribbling with heavy lifting. No pad use. Reports urgency. Denies dysuria, hematuria, or nocturia.   - GI: No diarrhea or rectal bleeding.   Most recent colonoscopy on 07/11/20. Patient reports follow-up in 10 years.   - Patient has baseline erectile dysfunction.     Current Symptoms:  Urinary: IPSS=1, he has some urgency , denies straining, incontinence, frequency, hematuria, dysuria.   GI: denies diarrhea or blood in stool. Review of Systems  See above.     Objective:          atorvastatin (LIPITOR) 10 mg tablet Take one tablet by mouth daily.    doxepin (SINEQUAN) 100 mg capsule Take one capsule by mouth at bedtime daily.    gabapentin (NEURONTIN) 300 mg capsule Take one capsule by mouth every 8 hours.    methocarbamoL (ROBAXIN) 750 mg tablet Take one tablet by mouth four times daily.    mirtazapine 45 mg tablet Take one tablet by mouth at bedtime daily.    modafiniL (PROVIGIL) 200 mg tablet Take  by mouth.    montelukast (SINGULAIR) 10 mg tablet Take one tablet by mouth at bedtime daily.    nabumetone (RELAFEN) 750 mg tablet Take one tablet by mouth twice daily.    zaleplon (SONATA) 10 mg capsule Take one capsule by mouth at bedtime daily.     Vitals:    11/30/22 1008   BP: (!) 146/78   Pulse: 78   Resp: 16   SpO2: 99%   PainSc: Zero   Height: 175.3 cm (5' 9)     Body mass index is 32 kg/m?Marland Kitchen                      Physical Exam      General:  A&Ox3, in no acute distress.  Head:  Normocephalic and atraumatic.  Eyes: Normal sclerae / conjunctivae.  Neck: trachea midline  Lungs: Breathing nonlabored, no wheezing  Abdomen: nondistended  Skin: no rashes or pallor  Psychiatric: Normal mood and affect      Lab Results   Component Value Date    PSA <0.01 11/30/2022    PSA <0.01 08/27/2022       Testosterone,Total   Date Value Ref Range Status   11/30/2022 213 (L) 270 - 1,070 NG/DL Final   29/56/2130 10 (L) 270 - 1,070 NG/DL Final              Assessment and Plan:  Robert Terrell with a  56 y.o. 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 Surgical pathology showed: pGleason 4+5 cancer, extraprostatic extension at left posterior, bladder neck invasion, left seminal vesicle invasion, positive margin at left posterior. pT3bN0 (0/8 nodes). Post-operative PSA has risen to 0.19 indicating recurrent prostate cancer after prostatectomy.       Prostate Cancer  - PSA today is <0.01 this remains undetectable.   - Testosterone today is 10- this remains in castrate range  - Discussed expected testosterone recovery timeline.   - Urination remains stable at baseline.   - Will continue to monitor PSA  - Follow up in 6 months for PSA and Testosterone.         Melodye Ped APRN, FNP-C  Nurse Practitioner  Department of Radiation Oncology [facility], [place]    Total time including obtaining and review of records, patient face to face time, order entry, placing referrals, and documentation: 30 minutes

## 2022-11-30 ENCOUNTER — Encounter: Admit: 2022-11-30 | Discharge: 2022-11-30 | Payer: BC Managed Care – PPO

## 2022-11-30 ENCOUNTER — Ambulatory Visit: Admit: 2022-11-30 | Discharge: 2022-11-30 | Payer: BC Managed Care – PPO

## 2022-11-30 DIAGNOSIS — C61 Malignant neoplasm of prostate: Secondary | ICD-10-CM

## 2022-11-30 LAB — PROSTATIC SPECIFIC ANTIGEN-PSA

## 2022-11-30 LAB — TESTOSTERONE,TOTAL: TESTOSTERONE, TOTAL: 213 ng/dL — ABNORMAL LOW (ref 270–1070)

## 2023-05-30 ENCOUNTER — Encounter: Admit: 2023-05-30 | Discharge: 2023-05-30 | Payer: BC Managed Care – PPO

## 2023-05-30 NOTE — Progress Notes
Radiation Oncology Follow-Up Note    Date: 06/02/2023       Robert Terrell is a 57 y.o. male.     There were no encounter diagnoses.  Staging:  Cancer Staging   No matching staging information was found for the patient.        Subjective:           Cancer Staging   No matching staging information was found for the patient.        History of Present Illness  Robert Terrell with a  57 y.o. 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 Surgical pathology showed: pGleason 4+5 cancer, extraprostatic extension at left posterior, bladder neck invasion, left seminal vesicle invasion, positive margin at left posterior. pT3bN0 (0/8 nodes). Post-operative PSA has risen to 0.19 indicating recurrent prostate cancer after prostatectomy.     Robert Terrell presents today for his 6 month follow up s/p salvage radiation therapy. He is having some hot flashes still but very minimal amount. He reports he feels he gets his energy is coming back.  He continues to remain active. 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     He denies any changes in his health or concerns at today's visit.     Treatment Plan: Salvage radiation therapy (37 fractions) (completed on 05/26/22)  + ADT x 6 months.     Lupron Dates: 03/08/22, 06/07/22,    PSA after prostatectomy:  - 03/19/21: <0.13  - 06/30/21: <0.13  - 10/27/21: 0.14  - 01/25/22: 0.19     Baseline symptoms:  - Urinary: IPSS score 3. Dribbling with heavy lifting. No pad use. Reports urgency. Denies dysuria, hematuria, or nocturia.   - GI: No diarrhea or rectal bleeding.   Most recent colonoscopy on 07/11/20. Patient reports follow-up in 10 years.   - Patient has baseline erectile dysfunction.     Current Symptoms:  Urinary: IPSS=0, he has some urgency , denies straining, incontinence, frequency, hematuria, dysuria.   GI: denies diarrhea or blood in stool. Review of Systems  See above.     Objective:          atorvastatin (LIPITOR) 10 mg tablet Take one tablet by mouth daily.    doxepin (SINEQUAN) 100 mg capsule Take one capsule by mouth at bedtime daily.    gabapentin (NEURONTIN) 300 mg capsule Take one capsule by mouth every 8 hours.    methocarbamoL (ROBAXIN) 750 mg tablet Take one tablet by mouth four times daily.    mirtazapine 45 mg tablet Take one tablet by mouth at bedtime daily.    modafiniL (PROVIGIL) 200 mg tablet Take  by mouth.    montelukast (SINGULAIR) 10 mg tablet Take one tablet by mouth at bedtime daily.    nabumetone (RELAFEN) 750 mg tablet Take one tablet by mouth twice daily.    zaleplon (SONATA) 10 mg capsule Take one capsule by mouth at bedtime daily.     Vitals:    06/02/23 0929   BP: 130/75   Pulse: 62   SpO2: 98%   PainSc: Zero       There is no height or weight on file to calculate BMI.                      Physical Exam      General:  A&Ox3, in no  acute distress.  Head:  Normocephalic and atraumatic.  Eyes: Normal sclerae / conjunctivae.  Neck: trachea midline  Lungs: Breathing nonlabored, no wheezing  Abdomen: nondistended  Skin: no rashes or pallor  Psychiatric: Normal mood and affect      Lab Results   Component Value Date    PSA <0.01 06/02/2023    PSA <0.01 11/30/2022    PSA <0.01 08/27/2022       Testosterone,Total   Date Value Ref Range Status   06/02/2023 316 270 - 1,070 NG/DL Final   29/51/8841 660 (L) 270 - 1,070 NG/DL Final   63/12/6008 10 (L) 270 - 1,070 NG/DL Final              Assessment and Plan:  Robert Terrell with a  57 y.o. 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 Surgical pathology showed: pGleason 4+5 cancer, extraprostatic extension at left posterior, bladder neck invasion, left seminal vesicle invasion, positive margin at left posterior. pT3bN0 (0/8 nodes). Post-operative PSA has risen to 0.19 indicating recurrent prostate cancer after prostatectomy.       Prostate Cancer  - PSA today is <0.01 this remains undetectable.   - Testosterone today is 316- this is no longer in castrate range.   - Discussed expected testosterone recovery timeline.   - Urination remains stable at baseline. Bowels are at baseline as well. No late radiation toxicities  - Will continue to monitor PSA  - Follow up in 6 months for PSA and Testosterone.         Melodye Ped APRN, FNP-C  Nurse Practitioner  Department of Radiation Oncology [facility]

## 2023-06-01 ENCOUNTER — Encounter: Admit: 2023-06-01 | Discharge: 2023-06-01 | Payer: BC Managed Care – PPO

## 2023-06-01 DIAGNOSIS — C61 Malignant neoplasm of prostate: Secondary | ICD-10-CM

## 2023-06-02 ENCOUNTER — Encounter: Admit: 2023-06-02 | Discharge: 2023-06-02 | Payer: BC Managed Care – PPO

## 2023-06-02 ENCOUNTER — Ambulatory Visit: Admit: 2023-06-02 | Discharge: 2023-06-02 | Payer: BC Managed Care – PPO

## 2023-06-02 DIAGNOSIS — C61 Malignant neoplasm of prostate: Secondary | ICD-10-CM

## 2023-06-02 LAB — PROSTATIC SPECIFIC ANTIGEN-PSA

## 2023-06-02 LAB — TESTOSTERONE,TOTAL: TESTOSTERONE, TOTAL: 316 ng/dL — ABNORMAL HIGH (ref 270–1070)

## 2023-07-07 IMAGING — MR MR cervical spine wo con
4 of 6 series · 22 of 48 positions shown · non-contrast
Comparison: Limited comparisons with cervical radiographs dated 01/11/2022

EXAMINATION:  MRI cervical spine without IV contrast
INDICATION: neuralgia and neuritis, surgery 7544, pain down left arm
TECHNIQUE: Multiplanar, multisequence MR examination of the cervical spine was obtained without IV contrast.

[Series 101: survey · axial · 10.0mm · 1.17mm/px · z∈[-15,+149]mm · 3 of 9 slices shown]
[im 1/9]
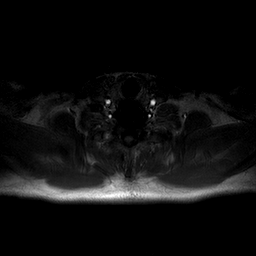
[im 5/9]
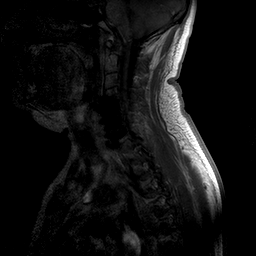
[im 9/9]
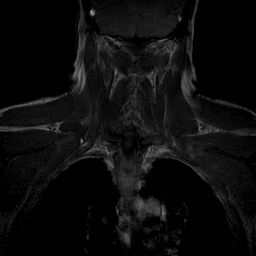

[Series 302: et2 sag · sagittal · 3.0mm · 0.39mm/px · 5 of 17 slices shown]
[im 1/17]
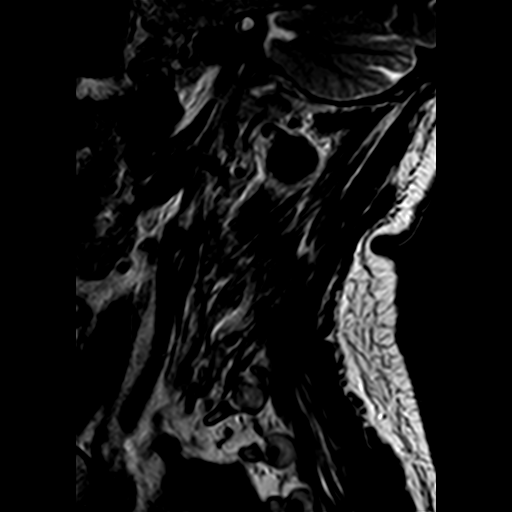
[im 5/17]
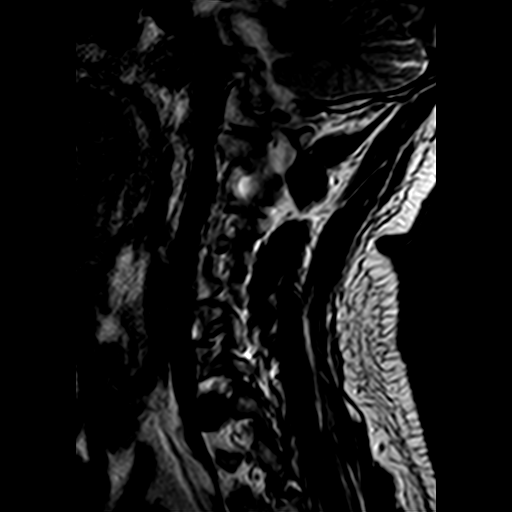
[im 9/17]
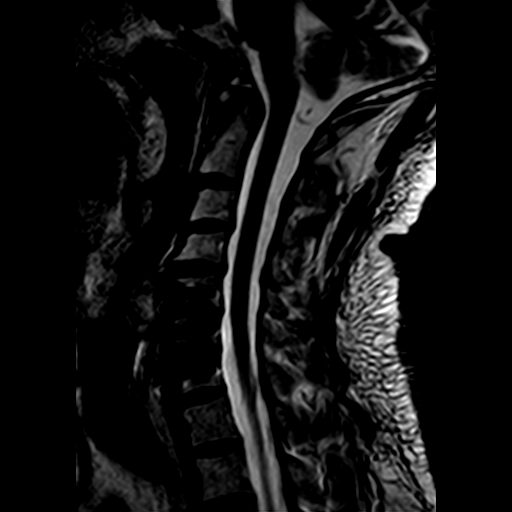
[im 13/17]
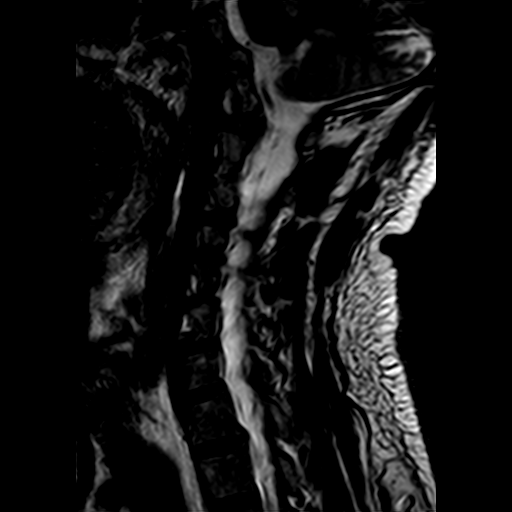
[im 17/17]
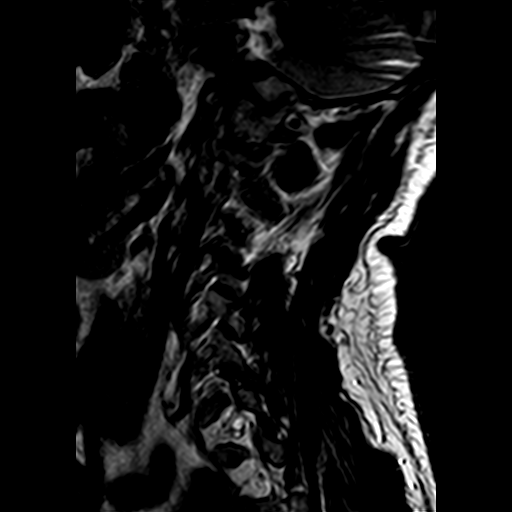

[Series 601: STIR · sagittal · 3.0mm · 0.50mm/px · 5 of 17 slices shown]
[im 1/17]
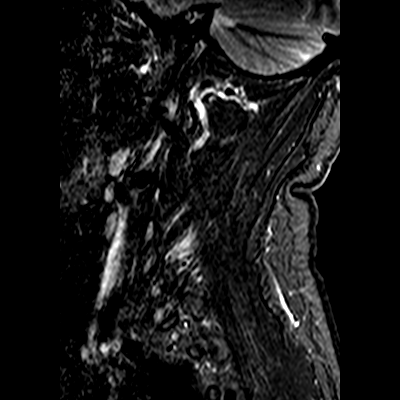
[im 5/17]
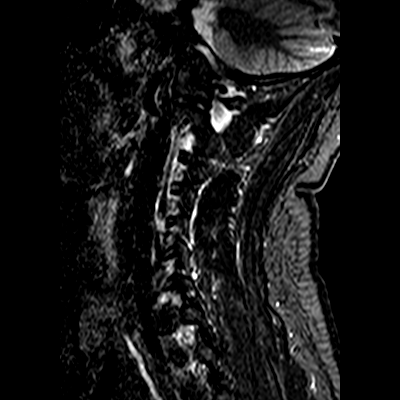
[im 9/17]
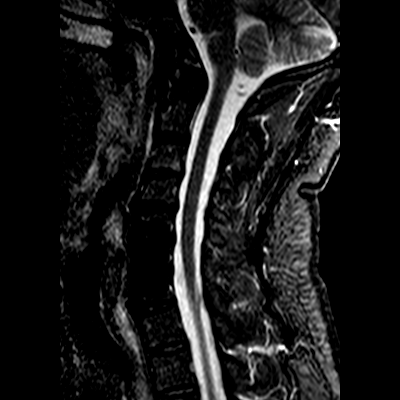
[im 13/17]
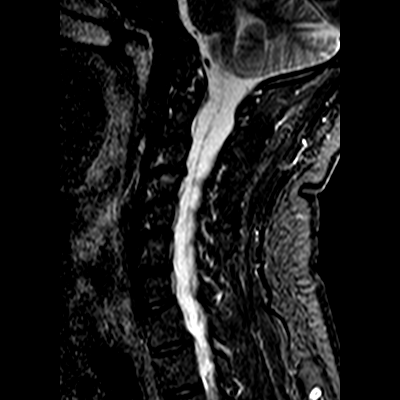
[im 17/17]
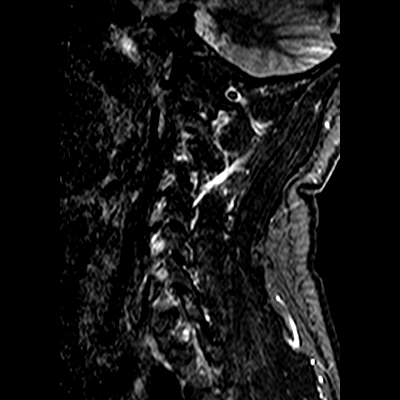

[Series 801: T2 · axial · 3.0mm · 0.39mm/px · z∈[-54,+60]mm · 9 of 40 slices shown]
[im 1/40]
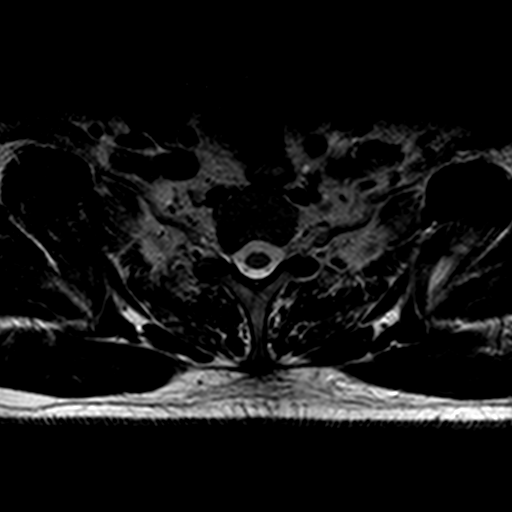
[im 8/40]
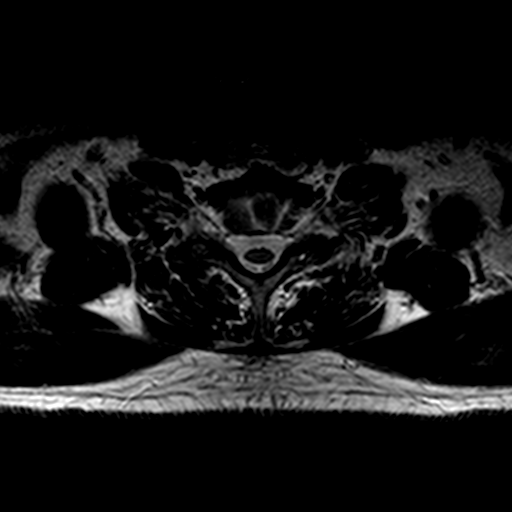
[im 11/40]
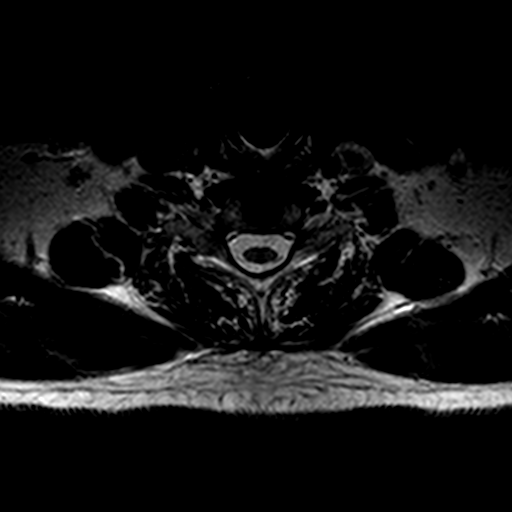
[im 18/40]
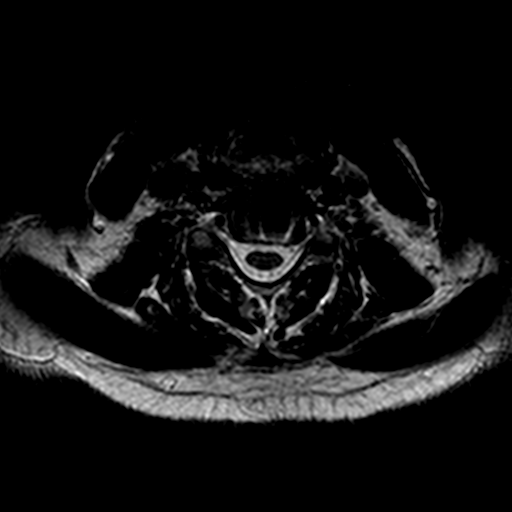
[im 22/40]
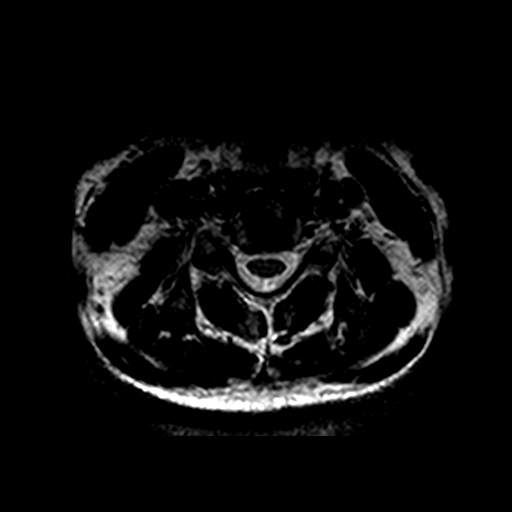
[im 29/40]
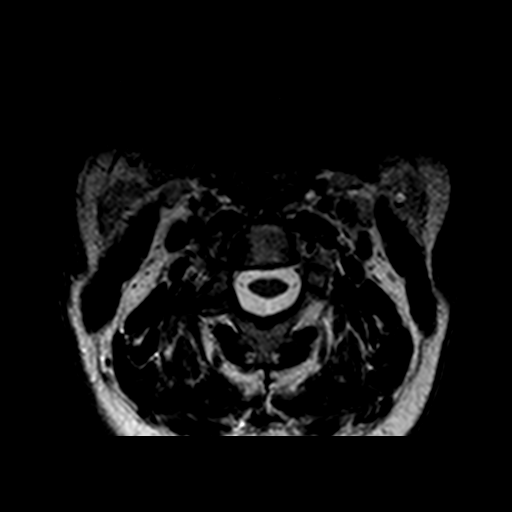
[im 32/40]
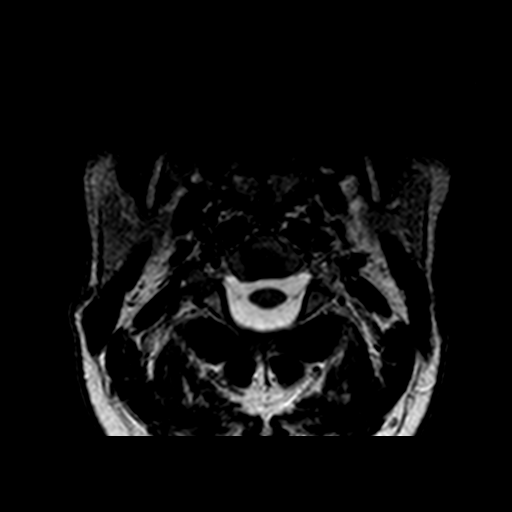
[im 36/40]
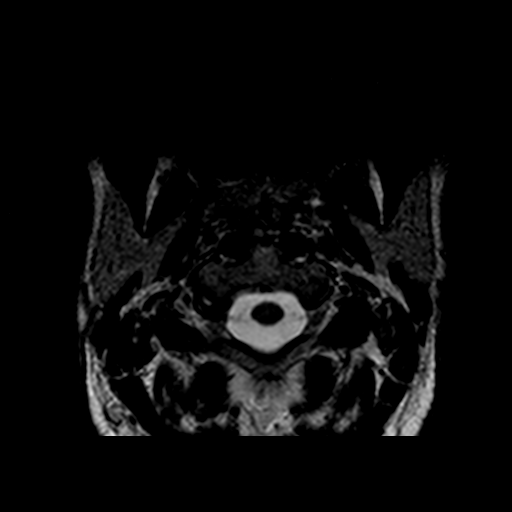
[im 40/40]
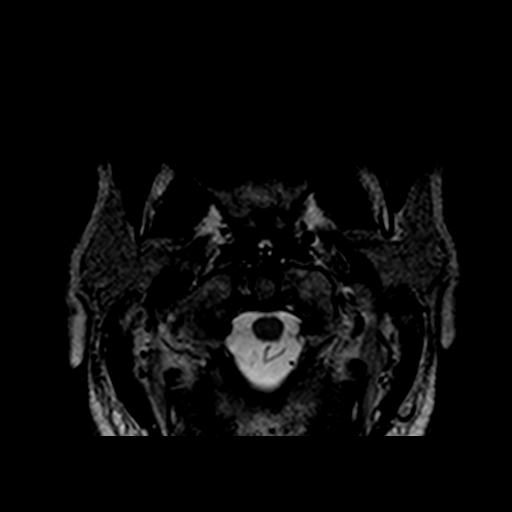

[22 of 48 positions shown; findings below may reference images not displayed]

FINDINGS: There are postsurgical changes of C5--C7 anterior cervical fusion and discectomy and intervertebral disc spacer placement.  Min is normal.  Visualized bone marrow is normal without fracture.  There are multilevel degenerative disc and joint disease throughout.  Multilevel disc desiccation.  Cervical cord is normal in signal characteristics and caliber.  Visualized brainstem and posterior fossa are within normal limits.
C2-C3:  No significant spinal canal or neural foraminal narrowing.
C3-C4:  Disc osteophyte complex with right lateral recess/foraminal disc protrusion.  Mild right facet and uncovertebral hypertrophic changes.  There is right lateral effacement of the ventral thecal sac.  There is moderate right neural foraminal narrowing.  No significant left neural foraminal narrowing.
C4-C5:  Disc osteophyte complex minimally effacing the ventral thecal sac.  There is mild right facet and uncovertebral hypertrophic changes.  There is mild right neural foraminal narrowing.  No significant left neural foraminal narrowing.
C5-C6:  Changes of ACDF.  No significant spinal canal narrowing.  Moderate left and mild right foraminal and uncovertebral hypertrophic changes.  There is moderate to severe left and mild right neural foraminal narrowing.
C6-C7:  Changes of ACDF.  No significant spinal canal narrowing.  Moderate left and mild right uncovertebral and facet hypertrophic changes.  There is mild-to-moderate left and mild right neural foraminal narrowing.
C7-T1:  No significant spinal canal or neural foraminal narrowing.
IMPRESSION: Postsurgical changes of C5-C7 anterior cervical discectomy and fusion.
Multilevel degenerative disc and joint disease in the cervical spine with foraminal stenosis as detailed above, including C5-C6 moderate to severe left neural foraminal stenosis, C6-C7 mild-to-moderate left neural foraminal stenosis and C3-C4 moderate right neural foraminal stenosis.  Several levels demonstrate ventral thecal sac effacement without severe spinal canal stenosis.

## 2023-11-22 NOTE — Progress Notes
Radiation Oncology Follow-Up Note    Date: 11/28/2023       Robert Terrell is a 57 y.o. male.     There were no encounter diagnoses.  Staging:  Cancer Staging   No matching staging information was found for the patient.        Subjective:           Cancer Staging   No matching staging information was found for the patient.        History of Present Illness  Robert Terrell with a  57 y.o. 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 Surgical pathology showed: pGleason 4+5 cancer, extraprostatic extension at left posterior, bladder neck invasion, left seminal vesicle invasion, positive margin at left posterior. pT3bN0 (0/8 nodes). Post-operative PSA has risen to 0.19 indicating recurrent prostate cancer after prostatectomy.     [name] presents today for his 6 month interval follow up s/p salvage radiation therapy. He reports his hot flashes have resolved. He states his energy levels are stable.  He continues to remain active. He drives a lot for his work. He is on the road today.     He reports he had covid recently. He recovered well from this. He didn't have many symptoms except for a fever.     He denies any changes in his health or concerns at today's visit.     Treatment Plan: Salvage radiation therapy (37 fractions) (completed on 05/26/22)  + ADT x 6 months.     Lupron Dates: 03/08/22, 06/07/22,    PSA after prostatectomy:  - 03/19/21: <0.13  - 06/30/21: <0.13  - 10/27/21: 0.14  - 01/25/22: 0.19     Baseline symptoms:  - Urinary: IPSS score 3. Dribbling with heavy lifting. No pad use. Reports urgency. Denies dysuria, hematuria, or nocturia.   - GI: No diarrhea or rectal bleeding.   Most recent colonoscopy on 07/11/20. Patient reports follow-up in 10 years.   - Patient has baseline erectile dysfunction.     Current Symptoms:  Urinary: IPSS=0, he has some urgency , denies straining, incontinence, frequency, hematuria, dysuria.   GI: denies diarrhea or blood in stool.            Review of Systems  See above.     Objective:          atorvastatin (LIPITOR) 10 mg tablet Take one tablet by mouth daily.    doxepin (SINEQUAN) 100 mg capsule Take one capsule by mouth at bedtime daily.    gabapentin (NEURONTIN) 300 mg capsule Take one capsule by mouth every 8 hours.    methocarbamoL (ROBAXIN) 750 mg tablet Take one tablet by mouth four times daily.    mirtazapine 45 mg tablet Take one tablet by mouth at bedtime daily.    modafiniL (PROVIGIL) 200 mg tablet Take  by mouth.    montelukast (SINGULAIR) 10 mg tablet Take one tablet by mouth at bedtime daily.    nabumetone (RELAFEN) 750 mg tablet Take one tablet by mouth twice daily.    zaleplon (SONATA) 10 mg capsule Take one capsule by mouth at bedtime daily.                        Physical Exam      Deferred due to audio telehealth    Lab Results   Component Value Date    PSA  0.01 11/28/2023    PSA <0.01 06/02/2023    PSA <0.01 11/30/2022    PSA <0.01 08/27/2022       Testosterone,Total   Date Value Ref Range Status   11/28/2023 286 270 - 1,070 ng/dL Final   21/30/8657 846 270 - 1,070 NG/DL Final   96/29/5284 132 (L) 270 - 1,070 NG/DL Final   44/12/270 10 (L) 270 - 1,070 NG/DL Final              Assessment and Plan:  Robert Terrell with a  57 y.o. 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 Surgical pathology showed: pGleason 4+5 cancer, extraprostatic extension at left posterior, bladder neck invasion, left seminal vesicle invasion, positive margin at left posterior. pT3bN0 (0/8 nodes). Post-operative PSA has risen to 0.19 indicating recurrent prostate cancer after prostatectomy.       Prostate Cancer  - PSA today is 0.01. This remains low  - Testosterone today is 28 this is no longer in castrate range.   - Discussed expected testosterone recovery timeline.   - Urination remains stable at baseline. Bowels are at baseline as well. No late radiation toxicities  - Will continue to monitor PSA  - Follow up in 6 months for PSA and Testosterone.         Melodye Ped APRN, FNP-C  Nurse Practitioner  Department of Radiation Oncology [facility]

## 2023-11-25 ENCOUNTER — Encounter: Admit: 2023-11-25 | Discharge: 2023-11-25 | Payer: BC Managed Care – PPO

## 2023-11-28 ENCOUNTER — Encounter: Admit: 2023-11-28 | Discharge: 2023-11-28 | Payer: BC Managed Care – PPO

## 2023-11-28 ENCOUNTER — Ambulatory Visit: Admit: 2023-11-28 | Discharge: 2023-11-28 | Payer: BC Managed Care – PPO

## 2023-11-28 DIAGNOSIS — C61 Malignant neoplasm of prostate: Secondary | ICD-10-CM

## 2023-11-28 LAB — PROSTATIC SPECIFIC ANTIGEN-PSA: ~~LOC~~ BKR PROSTATIC SPEC AG: 0 ng/mL (ref <3.01)

## 2023-11-28 LAB — TESTOSTERONE,TOTAL: ~~LOC~~ BKR TESTOSTERONE, TOTAL: 286 ng/dL (ref 270–1070)

## 2024-01-27 ENCOUNTER — Encounter: Admit: 2024-01-27 | Discharge: 2024-01-27 | Payer: BC Managed Care – PPO

## 2024-05-15 ENCOUNTER — Encounter: Admit: 2024-05-15 | Discharge: 2024-05-15 | Payer: BLUE CROSS/BLUE SHIELD

## 2024-05-15 ENCOUNTER — Ambulatory Visit: Admit: 2024-05-15 | Discharge: 2024-05-16 | Payer: BLUE CROSS/BLUE SHIELD

## 2024-05-19 ENCOUNTER — Encounter: Admit: 2024-05-19 | Discharge: 2024-05-19 | Payer: BLUE CROSS/BLUE SHIELD

## 2024-05-24 ENCOUNTER — Encounter: Admit: 2024-05-24 | Discharge: 2024-05-24 | Payer: BLUE CROSS/BLUE SHIELD

## 2024-05-24 NOTE — Progress Notes
 Radiation Oncology Follow-Up Note    Date: 05/28/2024       Robert Terrell is a 58 y.o. male.     There were no encounter diagnoses.  Staging:  Cancer Staging   No matching staging information was found for the patient.        Subjective:           Cancer Staging   No matching staging information was found for the patient.        History of Present Illness  Robert Terrell with a  58 y.o. 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 Surgical pathology showed: pGleason 4+5 cancer, extraprostatic extension at left posterior, bladder neck invasion, left seminal vesicle invasion, positive margin at left posterior. pT3bN0 (0/8 nodes). Post-operative PSA has risen to 0.19 indicating recurrent prostate cancer after prostatectomy.     Robert Terrell presents today for his 6 month interval follow up s/p salvage radiation therapy. He reports his hot flashes have resolved. He states his energy levels are stable.  He continues to remain active.     He is dealing with some erectile dysfunction and would like to know what treatments are available.     He denies any changes in his health or concerns at today's visit.     Treatment Plan: Salvage radiation therapy (37 fractions) (completed on 05/26/22)  + ADT x 6 months.     Lupron  Dates: 03/08/22, 06/07/22,    PSA after prostatectomy:  - 03/19/21: <0.13  - 06/30/21: <0.13  - 10/27/21: 0.14  - 01/25/22: 0.19     Baseline symptoms:  - Urinary: IPSS score 3. Dribbling with heavy lifting. No pad use. Reports urgency. Denies dysuria, hematuria, or nocturia.   - GI: No diarrhea or rectal bleeding.   Most recent colonoscopy on 07/11/20. Patient reports follow-up in 10 years.   - Patient has baseline erectile dysfunction.     Current Symptoms:  Urinary: IPSS=0, he has some urgency , denies straining, incontinence, frequency, hematuria, dysuria.   GI: denies diarrhea or blood in stool.            Review of Systems  See above.     Objective:          atorvastatin (LIPITOR) 10 mg tablet Take one tablet by mouth daily.    doxepin (SINEQUAN) 100 mg capsule Take one capsule by mouth at bedtime daily.    gabapentin (NEURONTIN) 300 mg capsule Take one capsule by mouth every 8 hours.    methocarbamoL (ROBAXIN) 750 mg tablet Take one tablet by mouth four times daily.    mirtazapine 45 mg tablet Take one tablet by mouth at bedtime daily.    modafiniL (PROVIGIL) 200 mg tablet Take  by mouth.    montelukast (SINGULAIR) 10 mg tablet Take one tablet by mouth at bedtime daily.    nabumetone (RELAFEN) 750 mg tablet Take one tablet by mouth twice daily.    zaleplon (SONATA) 10 mg capsule Take one capsule by mouth at bedtime daily.                        Physical Exam      Deferred due to audio telehealth    Lab Results   Component Value Date    PSA 0.01 05/15/2024    PSA 0.01 11/28/2023    PSA <0.01 06/02/2023  PSA <0.01 11/30/2022    PSA <0.01 08/27/2022       Testosterone,Total   Date Value Ref Range Status   11/28/2023 286 270 - 1,070 ng/dL Final   16/09/9603 540 270 - 1,070 NG/DL Final   98/10/9146 829 (L) 270 - 1,070 NG/DL Final   56/21/3086 10 (L) 270 - 1,070 NG/DL Final              Assessment and Plan:  Robert Terrell with a  58 y.o. 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 Surgical pathology showed: pGleason 4+5 cancer, extraprostatic extension at left posterior, bladder neck invasion, left seminal vesicle invasion, positive margin at left posterior. pT3bN0 (0/8 nodes). Post-operative PSA has risen to 0.19 indicating recurrent prostate cancer after prostatectomy.       Prostate Cancer:  - PSA today is 0.01. This remains low  - Will request testosterone level from his local lab to review.   - Urination remains stable at baseline. Bowels are at baseline as well. No late radiation toxicities  - Will continue to monitor PSA  - Follow up in 6 months for PSA and Testosterone.     2. Erectile Dysfunction:  - Will trial cials 5mg  daily.   Patient denies use or access to nitrates or SL nitrates.  Patient educated on potential side effects of medication.  Instructed to go to the ER if an erection lasts longer than 4 hours.   Educated on proper use of medication and maximum daily dosage.        Clifford Dam APRN, FNP-C  Nurse Practitioner  Department of Radiation Oncology [facility]

## 2024-05-25 ENCOUNTER — Encounter: Admit: 2024-05-25 | Discharge: 2024-05-25 | Payer: BLUE CROSS/BLUE SHIELD

## 2024-05-25 NOTE — Telephone Encounter
 Faxed lab orders to the [facility] [place], [facility]) per patient's request.

## 2024-05-28 ENCOUNTER — Ambulatory Visit: Admit: 2024-05-28 | Discharge: 2024-05-28 | Payer: BLUE CROSS/BLUE SHIELD

## 2024-05-28 DIAGNOSIS — C61 Malignant neoplasm of prostate: Secondary | ICD-10-CM

## 2024-05-28 MED ORDER — TADALAFIL 5 MG PO TAB
5 mg | ORAL_TABLET | ORAL | 0 refills | 30.00000 days | Status: AC | PRN
Start: 2024-05-28 — End: ?

## 2024-06-14 ENCOUNTER — Encounter: Admit: 2024-06-14 | Discharge: 2024-06-14 | Payer: BLUE CROSS/BLUE SHIELD

## 2024-06-15 ENCOUNTER — Encounter: Admit: 2024-06-15 | Discharge: 2024-06-15 | Payer: BLUE CROSS/BLUE SHIELD

## 2024-06-15 MED ORDER — TADALAFIL 5 MG PO TAB
10 mg | ORAL_TABLET | ORAL | 1 refills | Status: AC | PRN
Start: 2024-06-15 — End: ?

## 2024-09-05 ENCOUNTER — Encounter: Admit: 2024-09-05 | Discharge: 2024-09-05 | Payer: BLUE CROSS/BLUE SHIELD

## 2024-09-05 MED ORDER — TADALAFIL 5 MG PO TAB
ORAL_TABLET | ORAL | 1 refills | 30.00000 days | Status: AC
Start: 2024-09-05 — End: ?

## 2024-11-19 ENCOUNTER — Encounter: Admit: 2024-11-19 | Discharge: 2024-11-19 | Payer: BLUE CROSS/BLUE SHIELD

## 2024-12-03 ENCOUNTER — Ambulatory Visit: Admit: 2024-12-03 | Discharge: 2024-12-03 | Payer: BLUE CROSS/BLUE SHIELD

## 2024-12-24 ENCOUNTER — Encounter: Admit: 2024-12-24 | Discharge: 2024-12-24 | Payer: BLUE CROSS/BLUE SHIELD

## 2024-12-25 ENCOUNTER — Encounter: Admit: 2024-12-25 | Discharge: 2024-12-25 | Payer: BLUE CROSS/BLUE SHIELD

## 2024-12-25 MED ORDER — TADALAFIL 5 MG PO TAB
ORAL_TABLET | 1 refills
Start: 2024-12-25 — End: ?

## 2024-12-31 ENCOUNTER — Encounter: Admit: 2024-12-31 | Discharge: 2024-12-31 | Payer: BLUE CROSS/BLUE SHIELD
